# Patient Record
Sex: Female | Born: 1995 | Race: Black or African American | Hispanic: No | Marital: Single | Smoking: Current some day smoker
Health system: Southern US, Community
[De-identification: ages and names within clinical notes are randomized; demographics above are authoritative.]

---

## 2014-08-23 ENCOUNTER — Encounter (HOSPITAL_COMMUNITY): Payer: Self-pay | Admitting: Emergency Medicine

## 2014-08-23 DIAGNOSIS — L500 Allergic urticaria: Secondary | ICD-10-CM | POA: Insufficient documentation

## 2014-08-23 DIAGNOSIS — Y939 Activity, unspecified: Secondary | ICD-10-CM | POA: Insufficient documentation

## 2014-08-23 DIAGNOSIS — Y999 Unspecified external cause status: Secondary | ICD-10-CM | POA: Insufficient documentation

## 2014-08-23 DIAGNOSIS — X58XXXA Exposure to other specified factors, initial encounter: Secondary | ICD-10-CM | POA: Insufficient documentation

## 2014-08-23 DIAGNOSIS — T7840XA Allergy, unspecified, initial encounter: Secondary | ICD-10-CM | POA: Insufficient documentation

## 2014-08-23 DIAGNOSIS — Z720 Tobacco use: Secondary | ICD-10-CM | POA: Insufficient documentation

## 2014-08-23 DIAGNOSIS — R220 Localized swelling, mass and lump, head: Secondary | ICD-10-CM | POA: Insufficient documentation

## 2014-08-23 DIAGNOSIS — Y929 Unspecified place or not applicable: Secondary | ICD-10-CM | POA: Insufficient documentation

## 2014-08-23 NOTE — ED Notes (Signed)
Pt. reports itchy rashes/hives onset this evening at arms , torso and neck with tongue swelling . Respirations unlabored / airway intact.

## 2014-08-24 ENCOUNTER — Emergency Department (HOSPITAL_COMMUNITY)
Admission: EM | Admit: 2014-08-24 | Discharge: 2014-08-24 | Disposition: A | Discharge status: Home / Self Care | Payer: BLUE CROSS/BLUE SHIELD | Attending: Emergency Medicine | Admitting: Emergency Medicine

## 2014-08-24 DIAGNOSIS — T7840XA Allergy, unspecified, initial encounter: Secondary | ICD-10-CM

## 2014-08-24 MED ORDER — EPINEPHRINE 0.3 MG/0.3ML IJ SOAJ
0.3000 mg | Freq: Once | INTRAMUSCULAR | Status: AC
  Administered 2014-08-24: 0.3 mg via INTRAMUSCULAR
  Filled 2014-08-24: qty 0.3

## 2014-08-24 MED ORDER — FAMOTIDINE 20 MG PO TABS: 20.0000 mg | ORAL_TABLET | Freq: Two times a day (BID) | ORAL | Status: DC

## 2014-08-24 MED ORDER — PREDNISONE 20 MG PO TABS: 60.0000 mg | ORAL_TABLET | Freq: Every day | ORAL | Status: DC

## 2014-08-24 MED ORDER — DIPHENHYDRAMINE HCL 25 MG PO TABS: 25.0000 mg | ORAL_TABLET | Freq: Four times a day (QID) | ORAL | Status: DC

## 2014-08-24 MED ORDER — METHYLPREDNISOLONE SODIUM SUCC 125 MG IJ SOLR
125.0000 mg | Freq: Once | INTRAMUSCULAR | Status: AC
  Administered 2014-08-24: 125 mg via INTRAVENOUS
  Filled 2014-08-24: qty 2

## 2014-08-24 MED ORDER — FAMOTIDINE IN NACL 20-0.9 MG/50ML-% IV SOLN
20.0000 mg | Freq: Once | INTRAVENOUS | Status: AC
  Administered 2014-08-24: 20 mg via INTRAVENOUS
  Filled 2014-08-24: qty 50

## 2014-08-24 MED ORDER — EPINEPHRINE 0.3 MG/0.3ML IJ SOAJ: 0.3000 mg | Freq: Once | INTRAMUSCULAR | Status: AC

## 2014-08-24 MED ORDER — DIPHENHYDRAMINE HCL 50 MG/ML IJ SOLN
25.0000 mg | Freq: Once | INTRAMUSCULAR | Status: AC
  Administered 2014-08-24: 25 mg via INTRAVENOUS
  Filled 2014-08-24: qty 1

## 2014-08-24 NOTE — Discharge Instructions (Signed)
Allergies °Allergies may happen from anything your body is sensitive to. This may be food, medicines, pollens, chemicals, and nearly anything around you in everyday life that produces allergens. An allergen is anything that causes an allergy producing substance. Heredity is often a factor in causing these problems. This means you may have some of the same allergies as your parents. °Food allergies happen in all age groups. Food allergies are some of the most severe and life threatening. Some common food allergies are cow's milk, seafood, eggs, nuts, wheat, and soybeans. °SYMPTOMS  °· Swelling around the mouth. °· An itchy red rash or hives. °· Vomiting or diarrhea. °· Difficulty breathing. °SEVERE ALLERGIC REACTIONS ARE LIFE-THREATENING. °This reaction is called anaphylaxis. It can cause the mouth and throat to swell and cause difficulty with breathing and swallowing. In severe reactions only a trace amount of food (for example, peanut oil in a salad) may cause death within seconds. °Seasonal allergies occur in all age groups. These are seasonal because they usually occur during the same season every year. They may be a reaction to molds, grass pollens, or tree pollens. Other causes of problems are house dust mite allergens, pet dander, and mold spores. The symptoms often consist of nasal congestion, a runny itchy nose associated with sneezing, and tearing itchy eyes. There is often an associated itching of the mouth and ears. The problems happen when you come in contact with pollens and other allergens. Allergens are the particles in the air that the body reacts to with an allergic reaction. This causes you to release allergic antibodies. Through a chain of events, these eventually cause you to release histamine into the blood stream. Although it is meant to be protective to the body, it is this release that causes your discomfort. This is why you were given anti-histamines to feel better.  If you are unable to  pinpoint the offending allergen, it may be determined by skin or blood testing. Allergies cannot be cured but can be controlled with medicine. °Hay fever is a collection of all or some of the seasonal allergy problems. It may often be treated with simple over-the-counter medicine such as diphenhydramine. Take medicine as directed. Do not drink alcohol or drive while taking this medicine. Check with your caregiver or package insert for child dosages. °If these medicines are not effective, there are many new medicines your caregiver can prescribe. Stronger medicine such as nasal spray, eye drops, and corticosteroids may be used if the first things you try do not work well. Other treatments such as immunotherapy or desensitizing injections can be used if all else fails. Follow up with your caregiver if problems continue. These seasonal allergies are usually not life threatening. They are generally more of a nuisance that can often be handled using medicine. °HOME CARE INSTRUCTIONS  °· If unsure what causes a reaction, keep a diary of foods eaten and symptoms that follow. Avoid foods that cause reactions. °· If hives or rash are present: °· Take medicine as directed. °· You may use an over-the-counter antihistamine (diphenhydramine) for hives and itching as needed. °· Apply cold compresses (cloths) to the skin or take baths in cool water. Avoid hot baths or showers. Heat will make a rash and itching worse. °· If you are severely allergic: °· Following a treatment for a severe reaction, hospitalization is often required for closer follow-up. °· Wear a medic-alert bracelet or necklace stating the allergy. °· You and your family must learn how to give adrenaline or use   an anaphylaxis kit.  If you have had a severe reaction, always carry your anaphylaxis kit or EpiPen with you. Use this medicine as directed by your caregiver if a severe reaction is occurring. Failure to do so could have a fatal outcome. SEEK MEDICAL  CARE IF:  You suspect a food allergy. Symptoms generally happen within 30 minutes of eating a food.  Your symptoms have not gone away within 2 days or are getting worse.  You develop new symptoms.  You want to retest yourself or your child with a food or drink you think causes an allergic reaction. Never do this if an anaphylactic reaction to that food or drink has happened before. Only do this under the care of a caregiver. SEEK IMMEDIATE MEDICAL CARE IF:   You have difficulty breathing, are wheezing, or have a tight feeling in your chest or throat.  You have a swollen mouth, or you have hives, swelling, or itching all over your body.  You have had a severe reaction that has responded to your anaphylaxis kit or an EpiPen. These reactions may return when the medicine has worn off. These reactions should be considered life threatening. MAKE SURE YOU:   Understand these instructions.  Will watch your condition.  Will get help right away if you are not doing well or get worse. Document Released: 05/20/2002 Document Revised: 06/21/2012 Document Reviewed: 10/25/2007 [facility] Patient Information 2015 [place], [place]. This information is not intended to replace advice given to you by your health care provider. Make sure you discuss any questions you have with your health care provider.  Epinephrine Injection Epinephrine is a medicine given by injection to temporarily treat an emergency allergic reaction. It is also used to treat severe asthmatic attacks and other lung problems. The medicine helps to enlarge (dilate) the small breathing tubes of the lungs. A life-threatening, sudden allergic reaction that involves the whole body is called anaphylaxis. Because of potential side effects, epinephrine should only be used as directed by your caregiver. RISKS AND COMPLICATIONS Possible side effects of epinephrine injections include:  Chest pain.  Irregular or rapid heartbeat.  Shortness of  breath.  Nausea.  Vomiting.  Abdominal pain or cramping.  Sweating.  Dizziness.  Weakness.  Headache.  Nervousness. Report all side effects to your caregiver. HOW TO GIVE AN EPINEPHRINE INJECTION Give the epinephrine injection immediately when symptoms of a severe reaction begin. Inject the medicine into the outer thigh or any available, large muscle. Your caregiver can teach you how to do this. You do not need to remove any clothing. After the injection, call your local emergency services (911 in [place] Even if you improve after the injection, you need to be examined at a hospital emergency department. Epinephrine works quickly, but it also wears off quickly. Delayed reactions can occur. A delayed reaction may be as serious and dangerous as the initial reaction. HOME CARE INSTRUCTIONS  Make sure you and your family know how to give an epinephrine injection.  Use epinephrine injections as directed by your caregiver. Do not use this medicine more often or in larger doses than prescribed.  Always carry your epinephrine injection or anaphylaxis kit with you. This can be lifesaving if you have a severe reaction.  Store the medicine in a cool, dry place. If the medicine becomes discolored or cloudy, dispose of it properly and replace it with new medicine.  Check the expiration date on your medicine. It may be unsafe to use medicines past their expiration date.  Tell your caregiver about any other medicines you are taking. Some medicines can react badly with epinephrine.  Tell your caregiver about any medical conditions you have, such as diabetes, high blood pressure (hypertension), heart disease, irregular heartbeats, or if you are pregnant. SEEK IMMEDIATE MEDICAL CARE IF:  You have used an epinephrine injection. Call your local emergency services (911 in [place] Even if you improve after the injection, you need to be examined at a hospital emergency department to make sure your  allergic reaction is under control. You will also be monitored for adverse effects from the medicine.  You have chest pain.  You have irregular or fast heartbeats.  You have shortness of breath.  You have severe headaches.  You have severe nausea, vomiting, or abdominal cramps.  You have severe pain, swelling, or redness in the area where you gave the injection. Document Released: 02/22/2000 Document Revised: 05/19/2011 Document Reviewed: 11/13/2010 [facility] Patient Information 2015 [place], [place]. This information is not intended to replace advice given to you by your health care provider. Make sure you discuss any questions you have with your health care provider.

## 2014-08-24 NOTE — ED Provider Notes (Signed)
CSN: [id number]     Arrival date & time 08/23/14  2345 History  This chart was scribed for Marisa Severin, MD by Annye Asa, ED Scribe. This patient was seen in room D31C/D31C and the patient's care was started at 12:42 AM.    Chief Complaint  Patient presents with  . Urticaria   The history is provided by the patient. No language interpreter was used.     HPI Comments: Jean Rice is a 19 y.o. female who presents to the Emergency Department complaining of 1 hour of gradually improving red, "hot" rash to arms, torso, and neck with associated tongue swelling. She reports difficulty swallowing and breathing; describes, "My tongue is blocking my airway, it feels really swollen. 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 She denies any other new exposures. She denies itching.   No PCP  History reviewed. No pertinent past medical history. History reviewed. No pertinent past surgical history. No family history on file. History  Substance Use Topics  . Smoking status: Current Some Day Smoker  . Smokeless tobacco: Not on file  . Alcohol Use: Yes    Review of Systems  HENT: Positive for trouble swallowing.        Tongue swelling  Skin: Positive for rash.  All other systems reviewed and are negative.   Allergies  Review of patient's allergies indicates no known allergies.  Home Medications   Prior to Admission medications   Not on File   BP 118/69 mmHg  Pulse 80  Temp(Src) 99.1 F (37.3 C)  Resp 20  Ht 5\' 6"  (1.676 m)  Wt 135 lb (61.236 kg)  BMI 21.80 kg/m2  SpO2 97% Physical Exam  Constitutional: He is oriented to person, place, and time. He appears well-developed and well-nourished. No distress.  HENT:  Head: Normocephalic and atraumatic.  Right Ear: Tympanic membrane normal.  Left Ear: Tympanic membrane normal.  Mouth/Throat:  Oropharynx is clear and moist. No oropharyngeal exudate.  Tongue mildly enlarged  Eyes: EOM are normal. Pupils are equal, round, and reactive to light.  Neck: Normal range of motion. Neck supple. No JVD present.  Cardiovascular: Normal rate, regular rhythm and normal heart sounds.  Exam reveals no gallop and no friction rub.   No murmur heard. Pulmonary/Chest: Effort normal and breath sounds normal. No respiratory distress. He has no wheezes. He has no rales.  Abdominal: Soft. Bowel sounds are normal. He exhibits no mass. There is no tenderness. There is no rebound and no guarding.  Musculoskeletal: Normal range of motion. He exhibits no edema.  Moves all extremities normally.   Neurological: He is alert and oriented to person, place, and time. He displays normal reflexes.  Skin: Skin is warm and dry. Rash (Hives to left side of face and left arm) noted.  Urticaria to left face, arm  Psychiatric: He has a normal mood and affect. His behavior is normal.  Nursing note and vitals reviewed.   ED Course  Procedures   DIAGNOSTIC STUDIES: Oxygen Saturation is 97% on RA, adequate by my interpretation.    COORDINATION OF CARE: 12:49 AM Discussed treatment plan with pt at bedside and pt agreed to plan.   Labs Review Labs Reviewed - No data to display  Imaging Review No results found.   EKG Interpretation None      MDM  Final diagnoses:  Allergic reaction, initial encounter    I personally performed the services described in this documentation, which was scribed in my presence. The recorded information has been reviewed and is accurate.  19 yo female with urticaria, mild swelling of tongue without airway compromise.  To be treated with benadryl, pepcid, solumedrol and epi-pen.  WIll continue to monitor.  Pt has had improvement, has been here for 2 hours.  Pt is insistent on leaving.  Return precautions given.     Marisa Severin, MD 08/24/14 [phone]

## 2014-11-09 ENCOUNTER — Encounter (HOSPITAL_COMMUNITY): Payer: Self-pay | Admitting: Registered Nurse

## 2014-11-09 ENCOUNTER — Emergency Department (HOSPITAL_COMMUNITY)
Admission: EM | Admit: 2014-11-09 | Discharge: 2014-11-09 | Disposition: A | Discharge status: Home / Self Care | Payer: BLUE CROSS/BLUE SHIELD | Attending: Emergency Medicine | Admitting: Emergency Medicine

## 2014-11-09 ENCOUNTER — Encounter (HOSPITAL_COMMUNITY): Payer: Self-pay | Admitting: Emergency Medicine

## 2014-11-09 ENCOUNTER — Ambulatory Visit (HOSPITAL_COMMUNITY)
Admission: RE | Admit: 2014-11-09 | Discharge: 2014-11-09 | Disposition: A | Discharge status: Home / Self Care | Payer: BLUE CROSS/BLUE SHIELD | Attending: Psychiatry | Admitting: Psychiatry

## 2014-11-09 DIAGNOSIS — F4323 Adjustment disorder with mixed anxiety and depressed mood: Secondary | ICD-10-CM | POA: Insufficient documentation

## 2014-11-09 DIAGNOSIS — Z3202 Encounter for pregnancy test, result negative: Secondary | ICD-10-CM | POA: Insufficient documentation

## 2014-11-09 DIAGNOSIS — F419 Anxiety disorder, unspecified: Secondary | ICD-10-CM

## 2014-11-09 DIAGNOSIS — E010 Iodine-deficiency related diffuse (endemic) goiter: Secondary | ICD-10-CM

## 2014-11-09 DIAGNOSIS — Z720 Tobacco use: Secondary | ICD-10-CM | POA: Insufficient documentation

## 2014-11-09 LAB — COMPREHENSIVE METABOLIC PANEL
ALBUMIN: 4.6 g/dL (ref 3.5–5.0)
ALK PHOS: 62 U/L (ref 38–126)
ALT: 26 U/L (ref 14–54)
AST: 25 U/L (ref 15–41)
Anion gap: 4 — ABNORMAL LOW (ref 5–15)
BILIRUBIN TOTAL: 1.1 mg/dL (ref 0.3–1.2)
BUN: 11 mg/dL (ref 6–20)
CO2: 25 mmol/L (ref 22–32)
Calcium: 9.3 mg/dL (ref 8.9–10.3)
Chloride: 108 mmol/L (ref 101–111)
Creatinine, Ser: 0.6 mg/dL (ref 0.44–1.00)
GFR calc Af Amer: >60 mL/min (ref >60)
GFR calc non Af Amer: >60 mL/min (ref >60)
Glucose, Bld: 99 mg/dL (ref 65–99)
Potassium: 4.2 mmol/L (ref 3.5–5.1)
SODIUM: 137 mmol/L (ref 135–145)
TOTAL PROTEIN: 8.1 g/dL (ref 6.5–8.1)

## 2014-11-09 LAB — RAPID URINE DRUG SCREEN, HOSP PERFORMED
AMPHETAMINES: NOT DETECTED
BARBITURATES: NOT DETECTED
Benzodiazepines: NOT DETECTED
Cocaine: NOT DETECTED
OPIATES: NOT DETECTED
TETRAHYDROCANNABINOL: NOT DETECTED

## 2014-11-09 LAB — CBC WITH DIFFERENTIAL/PLATELET
Basophils Absolute: 0 10*3/uL (ref 0.0–0.1)
Basophils Relative: 0 % (ref 0–1)
EOS ABS: 0.1 10*3/uL (ref 0.0–0.7)
EOS PCT: 1 % (ref 0–5)
HEMATOCRIT: 33.7 % — AB (ref 36.0–46.0)
Hemoglobin: 10.7 g/dL — ABNORMAL LOW (ref 12.0–15.0)
LYMPHS PCT: 22 % (ref 12–46)
Lymphs Abs: 1.6 10*3/uL (ref 0.7–4.0)
MCH: 21.1 pg — AB (ref 26.0–34.0)
MCHC: 31.8 g/dL (ref 30.0–36.0)
MCV: 66.5 fL — ABNORMAL LOW (ref 78.0–100.0)
Monocytes Absolute: 0.4 10*3/uL (ref 0.1–1.0)
Monocytes Relative: 6 % (ref 3–12)
NEUTROS ABS: 5 10*3/uL (ref 1.7–7.7)
Neutrophils Relative %: 71 % (ref 43–77)
Platelets: 347 10*3/uL (ref 150–400)
RBC: 5.07 MIL/uL (ref 3.87–5.11)
RDW: 15.7 % — ABNORMAL HIGH (ref 11.5–15.5)
WBC: 7.1 10*3/uL (ref 4.0–10.5)

## 2014-11-09 LAB — ETHANOL: Alcohol, Ethyl (B): <5 mg/dL (ref <5)

## 2014-11-09 LAB — TSH: TSH: 0.72 u[IU]/mL (ref 0.350–4.500)

## 2014-11-09 LAB — T4, FREE: Free T4: 0.84 ng/dL (ref 0.61–1.12)

## 2014-11-09 LAB — I-STAT BETA HCG BLOOD, ED (MC, WL, AP ONLY)

## 2014-11-09 LAB — PREGNANCY, URINE: PREG TEST UR: NEGATIVE

## 2014-11-09 NOTE — ED Provider Notes (Signed)
CSN: [id number]     Arrival date & time 11/09/14  1050 History   First MD Initiated Contact with Patient 11/09/14 1056     No chief complaint on file.    (Consider location/radiation/quality/duration/timing/severity/associated sxs/prior Treatment) HPI 19 year old female who presents today from [facility]. She presented to them stating that she was not feeling very motivated and is having difficulty concentrating. She was seen and evaluated there by his practitioner and her note is referenced. Here patient denies depression, suicidal ideation, homicidal ideation, or psychotic features. She states that she does feel safe being discharged. She states that she has had some difficulties with concentrating recently. She states she is taking online classes and feels that she has difficulty being motivated to do her work. 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 She states this is very unstructured and she can come and go as she wants which she feels has created some problems for her. She denies any history of mental health diagnoses or problems. She states she has had some self diagnosed anxiety in the past. She states that she feels somewhat anxious here. She denies any sleep difficulties, changes in eating habits, or weight. She was told the past that her thyroid was enlarged and she should have followed up but she did not. She states this was several years ago. History reviewed. No pertinent past medical history. History reviewed. No pertinent past surgical history. No family history on file. Social History  Substance Use Topics  . Smoking status: Current Some Day Smoker  . Smokeless tobacco: None  . Alcohol Use: 1.2 oz/week    2 Shots of liquor per week   OB History    No data available     Review of Systems  All other systems reviewed and are negative.     Allergies  Review of patient's allergies indicates no known allergies.  Home Medications   Prior to Admission  medications   Medication Sig Start Date End Date Taking? Authorizing Provider  EPINEPHrine 0.3 mg/0.3 mL IJ SOAJ injection Inject 0.3 mLs (0.3 mg total) into the muscle once. 08/24/14  Yes Marisa Severin, MD  diphenhydrAMINE (BENADRYL) 25 MG tablet Take 1 tablet (25 mg total) by mouth every 6 (six) hours. Patient not taking: Reported on 11/09/2014 08/24/14   Marisa Severin, MD  famotidine (PEPCID) 20 MG tablet Take 1 tablet (20 mg total) by mouth 2 (two) times daily. Patient not taking: Reported on 11/09/2014 08/24/14   Marisa Severin, MD  predniSONE (DELTASONE) 20 MG tablet Take 3 tablets (60 mg total) by mouth daily. Patient not taking: Reported on 11/09/2014 08/24/14   Marisa Severin, MD   BP 124/63 mmHg  Pulse 67  Resp 18  SpO2 100% Physical Exam  Constitutional: She is oriented to person, place, and time. She appears well-developed and well-nourished.  HENT:  Head: Normocephalic and atraumatic.  Right Ear: External ear normal.  Left Ear: External ear normal.  Nose: Nose normal.  Mouth/Throat: Oropharynx is clear and moist.  Eyes: Conjunctivae and EOM are normal. Pupils are equal, round, and reactive to light.  Neck: Normal range of motion. Neck supple. Thyromegaly present.  Cardiovascular: Normal rate, regular rhythm, normal heart sounds and intact distal pulses.   Pulmonary/Chest: Effort normal and breath sounds normal.  Abdominal: Soft. Bowel sounds are normal.  Musculoskeletal: Normal range of motion.  Neurological: She is alert and oriented to person, place, and time. She has normal reflexes.  Skin: Skin is warm and dry.  Psychiatric: She has a normal mood and affect. Her behavior is normal. Judgment and thought content normal.  Nursing note and vitals reviewed.   ED Course  Procedures (including critical care time) Labs Review Labs Reviewed  T3  T4  T4, FREE  TSH    Imaging Review No results found. I have personally reviewed and evaluated these images and lab results as part of my  medical decision-making.   EKG Interpretation None      MDM   Final diagnoses:  Thyromegaly  Anxiety   patient is hemodynamically stable here. She does not have any indications for hospitalization. Patient has been seen and evaluated at behavioral health and has follow-up set up at [facility]. He instructed her that she can receive counseling their beginning immediately. Her thyroid does seem somewhat enlarged here. Her TSH is normal. I will set her up for outpatient follow-up for this. I have re-examined patient. I discussed her symptoms with her. She continues to deny any significant signs of depression such as suicidality or psychosis. She is eating, sleeping, and going about her activities of daily living. She does express some anxiety which appears to be appropriate regarding her schoolwork. She has follow-up set up for her Behavioral Health with a psychiatrist on October 3 and has referrals for immediate counseling. I will given her referral for follow-up for recheck of her thyroid.  Margarita Grizzle, MD 11/09/14 [phone]

## 2014-11-09 NOTE — Clinical Social Work Note (Signed)
Pt was transported via [facility] to [facility] ED  .[place], [place] [facility]

## 2014-11-09 NOTE — Progress Notes (Addendum)
Pt confirms with ED Cm she does not have pcp, is a [employer] student and had been here  Pt  Has not seen counselors or psychiatry locally  Confirmed pt address and telephone    CM discussed and provided written information for uninsured accepting pcps, discussed the importance of pcp vs EDP services for f/u care, www.needymeds.org, www.goodrx.com, discounted pharmacies and other [employer] such as [facility] , [facility], affordable care act, financial assistance, uninsured dental services, [place] med assist, DSS and  health department  Reviewed resources for [employer] uninsured accepting pcps like [facility], family medicine at [facility], [facility], [facility], local urgent care centers, [facility], [facility] family practice, general medical clinics, family services of the [place], [facility] urgent care plus others, medication resources, [facility] out patient pharmacies and housing Pt voiced understanding and appreciation of resources provided   Provided [facility] contact information  Pt inquired about a pregnancy test CM updated ED RN, Matt & EDP, Ray Pt updated test to be offered

## 2014-11-09 NOTE — ED Notes (Signed)
Pt being sent over from [facility], pt will already be changed out. Pt needs blood work and urine and then can go back to [facility] directly.

## 2014-11-09 NOTE — Consult Note (Signed)
  Jean Rice  12-Dec-1995  [id number]   Spoke with patient face to face as a walk in at [facility].  Patient states that she is not feeling her self. 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  Patient denies suicidal thoughts and states that she does not want to kill herself but was uncomfortable when trying to set her up with outpatient services and send her home.  Patient has no psych history.  She denies homicidal ideation, psychosis, and paranoia.  Patient has and appointment set with [facility] outpatient services set for October 3rd.   Spoke with Dr. Jannifer Franklin and discussed patient uneasiness when attempting to send her home after setting up outpatient services and informed that felt more comfortable if patient was observed over night to assure that patient wasn't a danger to her self and was appropriate for only outpatient services at this time.  Patient in agreement that she does not need to be home at this time.    Spoke with charge nurse at [facility] RN and instructed to have patient change clothes and sent to room TR4.  Patient will be sent to [facility] via [facility].   Shuvon B. Rankin FNP-BC  Patient seen face-to-face for psychiatric evaluation, chart reviewed and case discussed with the physician extender and developed treatment plan. Reviewed the information documented and agree with the treatment plan. Thedore Mins, MD

## 2014-11-09 NOTE — ED Notes (Signed)
Patient from [facility], c/o feeling less motivated than usually and difficulty concentrating x past several months. States she is in school and does not focus well or try as hard in school as she should. 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 Patient states she wants to spend the night at [facility] to help her get more focused and stop slacking off at school. On exam, thyroid is enlarged. Pt states she was instructed to get a thyroid biopsy but never did. Denies any other medical or psychiatric history.

## 2014-11-09 NOTE — Discharge Instructions (Signed)
These follow-up with counselor and psychiatrist as previously referred. Please recheck with physician for follow-up regarding thyroid.

## 2014-11-09 NOTE — ED Notes (Signed)
Bed: [facility] Expected date:  Expected time:  Means of arrival:  Comments: [facility]
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
## 2014-11-10 LAB — T3: T3, Total: 113 ng/dL (ref 71–180)

## 2014-11-10 LAB — T4: T4, Total: 9.2 ug/dL (ref 4.5–12.0)

## 2014-12-10 ENCOUNTER — Emergency Department (HOSPITAL_COMMUNITY)
Admission: EM | Admit: 2014-12-10 | Discharge: 2014-12-10 | Disposition: A | Discharge status: Home / Self Care | Payer: BLUE CROSS/BLUE SHIELD | Attending: Emergency Medicine | Admitting: Emergency Medicine

## 2014-12-10 ENCOUNTER — Encounter (HOSPITAL_COMMUNITY): Payer: Self-pay | Admitting: *Deleted

## 2014-12-10 DIAGNOSIS — F32A Depression, unspecified: Secondary | ICD-10-CM | POA: Insufficient documentation

## 2014-12-10 DIAGNOSIS — F4323 Adjustment disorder with mixed anxiety and depressed mood: Secondary | ICD-10-CM | POA: Diagnosis not present

## 2014-12-10 DIAGNOSIS — Z720 Tobacco use: Secondary | ICD-10-CM | POA: Insufficient documentation

## 2014-12-10 DIAGNOSIS — F329 Major depressive disorder, single episode, unspecified: Secondary | ICD-10-CM | POA: Insufficient documentation

## 2014-12-10 NOTE — ED Notes (Signed)
PA at bedside Pt alert and oriented x4. Respirations even and unlabored, bilateral symmetrical rise and fall of chest. Skin warm and dry. In no acute distress. Denies needs.   

## 2014-12-10 NOTE — Progress Notes (Signed)
Received report from RN. TTS to initiate assessment

## 2014-12-10 NOTE — ED Notes (Addendum)
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   Pt has stopped working as a Horticulturist, commercial.At present pt denies SI/HI, AH/VH. Reports she thinks she is depressed, has lack of motivation or drive, and difficulty sleeping x1 month. Denies pain. Denies ETOH, cigarettes, or drugs.  No hx of self harm. Pt contracts for safety. Pt not in counseling, not taking any psychiatric medications.  Pt asking if they ever let people stay overnight for depression. rn explained that the provider would come in and talk with pt.

## 2014-12-10 NOTE — BHH Suicide Risk Assessment (Signed)
Suicide Risk Assessment  Discharge Assessment   [facility] Discharge Suicide Risk Assessment   Demographic Factors:  Low socioeconomic status, Living alone and Unemployed  Total Time spent with patient: 20 minutes  Musculoskeletal: Strength & Muscle Tone: within normal limits Gait & Station: normal Patient leans: N/A  Psychiatric Specialty Exam:     Blood pressure 114/57, pulse 72, temperature 99.5 F (37.5 C), temperature source Oral, resp. rate 16, last menstrual period 11/24/2014, SpO2 100 %.There is no weight on file to calculate BMI.  General Appearance: Casual and Fairly Groomed  Patent attorney::  Good  Speech:  Clear and Coherent and Normal Rate  Volume:  Normal  Mood:  Anxious and Depressed  Affect:  Congruent and Depressed  Thought Process:  Coherent, Goal Directed and Intact  Orientation:  Full (Time, Place, and Person)  Thought Content:  WDL  Suicidal Thoughts:  No  Homicidal Thoughts:  No  Memory:  Immediate;   Good Recent;   Good Remote;   Good  Judgement:  Good  Insight:  Fair  Psychomotor Activity:  Normal  Concentration:  Good  Recall:  NA  Fund of Knowledge:Fair  Language: Good  Akathisia:  NA  Handed:  Right  AIMS (if indicated):     Assets:  Desire for Improvement  ADL's:  Intact  Cognition: WNL        Has this patient used any form of tobacco in the last 30 days? (Cigarettes, Smokeless Tobacco, Cigars, and/or Pipes) N/A  Mental Status Per Nursing Assessment::   On Admission:     Current Mental Status by Physician: NA  Loss Factors: NA  Historical Factors: NA  Risk Reduction Factors:   Living with another person, especially a relative  Continued Clinical Symptoms:  Depression:   Insomnia  Cognitive Features That Contribute To Risk:  Polarized thinking    Suicide Risk:  Minimal: No identifiable suicidal ideation.  Patients presenting with no risk factors but with morbid ruminations; may be classified as minimal risk based on the  severity of the depressive symptoms  Principal Problem: Adjustment disorder with mixed anxiety and depressed mood Discharge Diagnoses:  Patient Active Problem List   Diagnosis Date Noted  . Adjustment disorder with mixed anxiety and depressed mood [F43.23] 11/09/2014    Priority: High  . Depression [F32.9]     Follow-up Information    Follow up with Please use the resources provided.      Plan Of Care/Follow-up recommendations:  Activity:  as tolerated Diet:  regular  Is patient on multiple antipsychotic therapies at discharge:  No   Has Patient had three or more failed trials of antipsychotic monotherapy by history:  No  Recommended Plan for Multiple Antipsychotic Therapies: NA    Earney Navy   PMHNP-BC 12/10/2014, 4:22 PM   Patient seen and I agree with treatment and plan  Jamse Belfast.D.

## 2014-12-10 NOTE — Progress Notes (Signed)
Unable to reach RN to complete and initiate tele-assessment. Will re-attempt again.    Janann Colonel. MSW, LCSW Therapeutic Triage Services-Triage Specialist   Phone: [phone]

## 2014-12-10 NOTE — Consult Note (Signed)
[facility] Face-to-Face Psychiatry Consult   Reason for Consult:  Depressive disorder with anxiety Referring Physician:  EDP Patient Identification: Jean Rice MRN:  [id number] Principal Diagnosis: Adjustment disorder with mixed anxiety and depressed mood Diagnosis:   Patient Active Problem List   Diagnosis Date Noted  . Adjustment disorder with mixed anxiety and depressed mood [F43.23] 11/09/2014    Priority: High    Total Time spent with patient: 1 hour  Subjective:   Jean Rice is a 19 y.o. female patient admitted with Depressive disorder with anxiety.  HPI:  AA female, 19 years old was evaluated today for feeling depressed and anxious.  She is a Consulting civil engineer at [employer] and was seen last month for symptoms of depression and anxiety.  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  Patient reports that since she stopped dancing in clubs she has been feeling depressed, insomnic and self isolating.  She noted she has no motivation for anything and stated that her concentration is poor.  Patient states she does not have friends any more and she would like to have a boy friend.  She reports her family members were angry at her for dancing in the club but are now supportive of her.  She reported one time counseling at her university but did not benefit from counseling.  She denies SI/HI/AVH and denies previous suicide attempt.  She denies Alcohol and drug use.  Past Psychiatric History:  Denies  Risk to Self: Suicidal Ideation: No Suicidal Intent: No Is patient at risk for suicide?: No Suicidal Plan?: No Access to Means: No What has been your use of drugs/alcohol within the last 12 months?: Patient denies How many times?: 0 Other Self Harm Risks: None Triggers for Past Attempts: None known Intentional Self Injurious Behavior: None Risk to Others: Homicidal Ideation: No Thoughts of Harm to Others: No Current Homicidal Intent:  No Current Homicidal Plan: No Access to Homicidal Means: No Identified Victim: None History of harm to others?: No Assessment of Violence: None Noted Violent Behavior Description: Patient is calm and cooperative Does patient have access to weapons?: No Criminal Charges Pending?: No Does patient have a court date: No Prior Inpatient Therapy: Prior Inpatient Therapy: No Prior Outpatient Therapy: Prior Outpatient Therapy: No  Past Medical History: History reviewed. No pertinent past medical history. History reviewed. No pertinent past surgical history. Family History: History reviewed. No pertinent family history.   Family Psychiatric  History: Denies Social History:  History  Alcohol Use  . 1.2 oz/week  . 2 Shots of liquor per week     History  Drug Use No    Comment: Patient denies use     Social History   Social History  . Marital Status: Single    Spouse Name: N/A  . Number of Children: N/A  . Years of Education: N/A   Social History Main Topics  . Smoking status: Current Some Day Smoker  . Smokeless tobacco: None  . Alcohol Use: 1.2 oz/week    2 Shots of liquor per week  . Drug Use: No     Comment: Patient denies use   . Sexual Activity: Not Asked   Other Topics Concern  . None   Social History Narrative   Additional Social History:    History of alcohol / drug use?: No history of alcohol / drug abuse    Allergies:  No Known Allergies  Labs: No results found for this or  any previous visit (from the past 48 hour(s)).  No current facility-administered medications for this encounter.   Current Outpatient Prescriptions  Medication Sig Dispense Refill  . EPINEPHrine 0.3 mg/0.3 mL IJ SOAJ injection Inject 0.3 mLs (0.3 mg total) into the muscle once. 1 Device 1    Musculoskeletal: Strength & Muscle Tone: within normal limits Gait & Station: normal Patient leans: N/A  Psychiatric Specialty Exam: Review of Systems  Constitutional: Negative.   HENT:  Negative.   Eyes: Negative.   Respiratory: Negative.   Cardiovascular: Negative.   Gastrointestinal: Negative.   Genitourinary: Negative.   Musculoskeletal: Negative.   Skin: Negative.   Neurological: Negative.   Endo/Heme/Allergies: Negative.     Blood pressure 114/57, pulse 72, temperature 99.5 F (37.5 C), temperature source Oral, resp. rate 16, last menstrual period 11/24/2014, SpO2 100 %.There is no weight on file to calculate BMI.  General Appearance: Casual and Fairly Groomed  Eye Contact::  Good  Speech:  Clear and Coherent and Normal Rate  Volume:  Normal  Mood:  Anxious and Depressed  Affect:  Congruent and Depressed  Thought Process:  Coherent, Goal Directed and Intact  Orientation:  Full (Time, Place, and Person)  Thought Content:  WDL  Suicidal Thoughts:  No  Homicidal Thoughts:  No  Memory:  Immediate;   Good Recent;   Good Remote;   Good  Judgement:  Good  Insight:  Fair  Psychomotor Activity:  Normal  Concentration:  Good  Recall:  NA  Fund of Knowledge:Fair  Language: Good  Akathisia:  NA  Handed:  Right  AIMS (if indicated):     Assets:  Desire for Improvement  ADL's:  Intact  Cognition: WNL  Sleep:      Disposition: Discharge home, seek outpatient counseling and MH care.  Written information is provided  Earney Navy    PMHNP-BC 12/10/2014 3:18 PM   Patient seen and I agree with treatment and plan  Diannia Ruder M.D.

## 2014-12-10 NOTE — ED Notes (Signed)
telepsych machine at bedside  

## 2014-12-10 NOTE — Discharge Instructions (Signed)
Read the information below.  You may return to the Emergency Department at any time for worsening condition or any new symptoms that concern you.   Please see your primary care provider or student health to discuss possible medications that may be helpful to you.  Use the resources provided below both for primary care and for counseling.    Depression Depression refers to feeling sad, low, down in the dumps, blue, gloomy, or empty. In general, there are two kinds of depression: 1. Normal sadness or normal grief. This kind of depression is one that we all feel from time to time after upsetting life experiences, such as the loss of a job or the ending of a relationship. This kind of depression is considered normal, is short lived, and resolves within a few days to 2 weeks. Depression experienced after the loss of a loved one (bereavement) often lasts longer than 2 weeks but normally gets better with time. 2. Clinical depression. This kind of depression lasts longer than normal sadness or normal grief or interferes with your ability to function at home, at work, and in school. It also interferes with your personal relationships. It affects almost every aspect of your life. Clinical depression is an illness. Symptoms of depression can also be caused by conditions other than those mentioned above, such as:  Physical illness. Some physical illnesses, including underactive thyroid gland (hypothyroidism), severe anemia, specific types of cancer, diabetes, uncontrolled seizures, heart and lung problems, strokes, and chronic pain are commonly associated with symptoms of depression.  Side effects of some prescription medicine. In some people, certain types of medicine can cause symptoms of depression.  Substance abuse. Abuse of alcohol and illicit drugs can cause symptoms of depression. SYMPTOMS Symptoms of normal sadness and normal grief include the following:  Feeling sad or crying for short periods of  time.  Not caring about anything (apathy).  Difficulty sleeping or sleeping too much.  No longer able to enjoy the things you used to enjoy.  Desire to be by oneself all the time (social isolation).  Lack of energy or motivation.  Difficulty concentrating or remembering.  Change in appetite or weight.  Restlessness or agitation. Symptoms of clinical depression include the same symptoms of normal sadness or normal grief and also the following symptoms:  Feeling sad or crying all the time.  Feelings of guilt or worthlessness.  Feelings of hopelessness or helplessness.  Thoughts of suicide or the desire to harm yourself (suicidal ideation).  Loss of touch with reality (psychotic symptoms). Seeing or hearing things that are not real (hallucinations) or having false beliefs about your life or the people around you (delusions and paranoia). DIAGNOSIS  The diagnosis of clinical depression is usually based on how bad the symptoms are and how long they have lasted. Your health care provider will also ask you questions about your medical history and substance use to find out if physical illness, use of prescription medicine, or substance abuse is causing your depression. Your health care provider may also order blood tests. TREATMENT  Often, normal sadness and normal grief do not require treatment. However, sometimes antidepressant medicine is given for bereavement to ease the depressive symptoms until they resolve. The treatment for clinical depression depends on how bad the symptoms are but often includes antidepressant medicine, counseling with a mental health professional, or both. Your health care provider will help to determine what treatment is best for you. Depression caused by physical illness usually goes away with appropriate medical  treatment of the illness. If prescription medicine is causing depression, talk with your health care provider about stopping the medicine, decreasing  the dose, or changing to another medicine. Depression caused by the abuse of alcohol or illicit drugs goes away when you stop using these substances. Some adults need professional help in order to stop drinking or using drugs. SEEK IMMEDIATE MEDICAL CARE IF:  You have thoughts about hurting yourself or others.  You lose touch with reality (have psychotic symptoms).  You are taking medicine for depression and have a serious side effect. FOR MORE INFORMATION  National Alliance on Mental Illness: www.nami.[employer] of Mental Health: http://www.maynard.net/ Document Released: 02/22/2000 Document Revised: 07/11/2013 Document Reviewed: 05/26/2011 [facility] Patient Information 2015 [place], [place]. This information is not intended to replace advice given to you by your health care provider. Make sure you discuss any questions you have with your health care provider.   Emergency Department Resource Guide 1) Find a Doctor and Pay Out of Pocket Although you won't have to find out who is covered by your insurance plan, it is a good idea to ask around and get recommendations. You will then need to call the office and see if the doctor you have chosen will accept you as a new patient and what types of options they offer for patients who are self-pay. Some doctors offer discounts or will set up payment plans for their patients who do not have insurance, but you will need to ask so you aren't surprised when you get to your appointment.  2) Contact Your Local Health Department Not all health departments have doctors that can see patients for sick visits, but many do, so it is worth a call to see if yours does. If you don't know where your local health department is, you can check in your phone book. The CDC also has a tool to help you locate your state's health department, and many state websites also have listings of all of their local health departments.  3) Find a Walk-in Clinic If your illness  is not likely to be very severe or complicated, you may want to try a walk in clinic. These are popping up all over the country in pharmacies, drugstores, and shopping centers. They're usually staffed by nurse practitioners or physician assistants that have been trained to treat common illnesses and complaints. They're usually fairly quick and inexpensive. However, if you have serious medical issues or chronic medical problems, these are probably not your best option.  No Primary Care Doctor: - Call Health Connect at  [phone] - they can help you locate a primary care doctor that  accepts your insurance, provides certain services, etc. - Physician Referral Service- [phone]  Chronic Pain Problems: Organization         Address  Phone   Notes  [facility]  [phone] Patients need to be referred by their primary care doctor.   Medication Assistance: Organization         Address  Phone   Notes  [facility] Medication [facility] [address] [place] [place], [place] [address] [phone] --Must be a resident of [facility] -- Must have NO insurance coverage whatsoever (no Medicaid/ Medicare, etc.) -- The pt. MUST have a primary care doctor that directs their care regularly and follows them in the community   [facility]ist  [phone]   [employer]  [phone]    Agencies that provide inexpensive medical care:  Organization         Address  Phone   Notes  [facility]  [phone]   [facility]    [phone]   [facility] [address] [place], [place] [address] [phone]   [facility] [address] [place], [place] [phone]   Planned Parenthood    [phone]   [facility]    [phone]   [facility]  [address], [place] Phone:  [phone], Fax:  [phone] Hours of Operation:  9 am - 6  pm, M-F.  Also accepts Medicaid/Medicare and self-pay.  [facility] for Children  [address], [place] Phone: [phone], Fax: [phone]. Hours of Operation:  8:30 am - 5:30 pm, M-F.  Also accepts Medicaid and self-pay.  [facility] [place] [address], [place] Phone: [phone]   [facility] [address] Natasha Bence [place], [place] [phone], Ext. [phone] Mondays & Thursdays: 7-9 AM.  First 15 patients are seen on a first come, first serve basis.    Medicaid-accepting [facility] Providers:  Organization         Address  Phone   Notes  [facility] [address], [place] [phone] Also accepts self-pay patients.  [facility] [address] Laurell Josephs [place], [place]  [phone]   [facility] [address], [place] [phone]   [facility] [address] [place] [phone]   Renaye Rakers [address], [place]   [phone] Only accepts [place] Access [place] patients after they have their name applied to their card.   Self-Pay (no insurance) in [facility]:  Organization         Address  Phone   Notes  Sickle Cell Patients, [facility] Internal Medicine [address] [place], [place] [phone]   [facility] [address] [place], [place] [phone]   [facility] [place]  [address], [place] [phone]   [facility]/Dr. Osei-Bonsu  [address], [place] or [address], [place] [phone] Phone number for both [place] and [place] locations is the same.  [facility] [address], [place] [phone]   [facility] [address] [place] or [address] [phone]   [facility] [address], [place] [phone], phone; [phone], fax Sees patients 1st and 3rd Saturday of every month.  Must not qualify for public or private insurance (i.e. Medicaid, Medicare, [place] Health Choice, Veterans' Benefits)  Household income should be no more than 200% of the poverty level The clinic cannot treat you if you are pregnant or think you are pregnant  Sexually transmitted diseases are not treated at the clinic.    Dental Care: Organization         Address  Phone  Notes  [facility] Department of [facility] [address] [place], [place] [phone] Accepts children up to age 90 who are enrolled in [place] or [place] Health Choice; pregnant women with a Medicaid card; and children who have applied for Medicaid or [place] Health Choice, but were declined,  whose parents can pay a reduced fee at time of service.  [facility] Department of [facility]  [address], [place] [phone] Accepts children up to age 57 who are enrolled in [place] or [place] Health Choice; pregnant women with a Medicaid card; and children who have applied for Medicaid or  Health Choice, but were declined, whose parents can pay a reduced fee at time of service.  [facility]  [address] [place], [place] [phone] Patients are seen by appointment only. Walk-ins are not accepted. [facility] will see patients 53 years of age and older. Monday - Tuesday (8am-5pm) Most Wednesdays (8:30-5pm) $30 per visit, cash only  [facility]  [address], [facility] [phone] Patients are seen by appointment only. Walk-ins are not accepted. [facility] will see patients 56 years of age and older. One Wednesday Evening (Monthly: Volunteer Based).  $30 per visit, cash only  [employer]  [phone] for adults; Children under age 66, call Graduate Pediatric Dentistry at [phone]. Children aged 66-14, please call [phone] to request a pediatric application.  Dental services are provided in all areas of dental care including fillings, crowns and bridges, complete and partial dentures, implants, gum treatment, root canals, and extractions. Preventive care is also provided. Treatment is provided to both adults and children. Patients are selected via a lottery and there is often a waiting list.   [facility] [address] [place]  [phone] [email or web address]   [facility] [address] [place], [place] [phone], Ext. [phone] Second and Fourth Thursday of each month, opens at 6:30 AM; Clinic ends at 9 AM.  Patients are seen on a first-come first-served basis, and a limited number are seen during each clinic.   [facility]  [address] Ether [place], [place] [phone]   Eligibility Requirements You must have lived in [place], [place], or [place] counties for at least the last three months.   You cannot be eligible for state or federal sponsored [place], including [employer], [place], or [employer].   You generally cannot be eligible for healthcare insurance through your employer.    How to apply: Eligibility screenings are held every Tuesday and Wednesday afternoon from 1:00 pm until 4:00 pm. You do not need an appointment for the interview!  [facility] [address] [place] [address], [place], [place] [phone]   [facility] Health Department  [phone]   [facility] Health Department  [phone]   [facility] Health Department  [phone]    Behavioral Health Resources in the Community: Intensive Outpatient Programs Organization         Address  Phone  Notes  [facility] [address] [place], [place] [phone]   [facility] Outpatient [address] [place], [place] [phone]   ADS: Alcohol & Drug Svcs  [address] [place], [place]  [phone]   [facility] Mental Health [address],  [place], [place] [phone] or [phone]   Substance Abuse Resources Organization         Address  Phone  Notes  Al[facility] Drug Services  [phone]   [facility]  [phone]   [facility]  [phone]   Floydene Flock  [phone]   Residential & Outpatient Substance Abuse Program  [phone]   Psychological Services Organization  Address  Phone  Notes  [facility]  [phone]   [facility] Services  [phone]   [facility] Mental Health [phone], [place] [phone] or [phone]    Mobile Crisis Teams Organization         Address  Phone  Notes  [facility], Mobile Crisis Care Unit  [phone]   [facility]  [address] [place], [place] [phone]   Doristine Locks [address] [place] [phone]    Self-Help/Support Groups Organization         Address  Phone             Notes  [facility] [place] - variety of support groups  336- I7437963 Call for more information  Narcotics Anonymous (NA), Caring Services [address] [place] [address], [employer] [place]  2 meetings at this location   Statistician         Address  Phone  Notes  [facility] [address],    [place]  [phone]   [facility]  [address], [place] [address], [place], [place] [phone]   [facility] [address] [place], [place] [phone] Admissions: 8am-3pm M-F  [facility] [address],    [place], [place] [phone]   [facility] [address] [place], [place], [place] [phone]   [facility] [address],  [place], [place] [phone]   [facility] - Intensive Outpatient [address] Laurell Josephs [address], [place], [place]  [phone]   [facility] (Addiction Recovery Care Assoc.) [address] [place]  [place], [place] [phone] or [phone]   [facility] (RTS) [address] [place], [place] [phone] Accepts Medicaid  [facility] [place] [address],  [place] [phone] Substance Abuse/Addiction Treatment   [facility] Organization         Address  Phone  Notes  [facility]  [phone]   Angie Fava, PhD [address] Ervin Knack [place], [place]   [phone] or [phone]   [facility] Behavioral   [address] [place], [place] [phone]   [facility] [address] [place], [place] [phone] Insurance/Medicaid/sponsorship through [facility] and Families [address]                                    [place], [place] [phone] Therapy/tele-psych/case  [facility] [address], [place] [phone]    Dr. Lolly Mustache  [phone]   [facility] [place]  [employer] [facility] Dept. 1) [address] [place] 2) [address], [place] 3)  [address], [place] [phone]  [phone]   [facility] Child Abuse Hotline 934-488-6615 or [phone] (After Hours)        Behavioral Health Resources in the [facility]  Intensive Outpatient Programs: [facility]      [address] [place], [place] [phone] Both a day and evening program       [facility] Outpatient     [address]        [place], [place] [address] [phone]         ADS: Alcohol & Drug  Svcs [address] [place] [phone]  [facility] Mental Health ACCESS LINE: [phone] or [phone] [address] [place], [place] [address] [email or web address]  Mobile Crisis Teams:                                        Therapeutic  Alternatives         Mobile Crisis Care Unit [phone]             [facility] [address] Ginette Otto [phone]                                         Interventionist [address] De[address] [place] [phone]  Self-Help/Support Groups: Mental Health Assoc. of [employer] of support groups 7016424570 (call for more info)  Narcotics Anonymous (NA) [facility] [address] [place] - 2 meetings at this location  Residential Treatment Programs:  [facility]      [address]        [place]       [phone]         [facility] [address], [place] [address] [place], [place]  [address] [phone]  [facility]  [address] [place], [place] [address] [phone] Admissions: 8am-3pm M-F  [facility]     [address]        [place], [place] [address]       [phone]         [facility] [address] Starling Manns [place], [place] [phone]  [facility] [address] [place], [place] [phone]  [facility] - Intensive Outpatient      [address]     [place], [place]       [phone]         [facility] (Addiction Recovery Care Assoc.)     [address] [place], [place] [phone] or [phone]  [facility] Services (RTS)  [address] [place], [place] [phone]  [facility] [address]                                               [address] [place] [phone]  [facility] Resources: [phone]               General Therapy                                                Angie Fava, PhD        [address] [place], [place] [address]         [phone]   Insurance  [address] Behavioral   [address] [place], [place]  [address] [phone]  Floydene Flock  Recovery [address] [place], [place] [address] [phone] Insurance/Medicaid/sponsorship through [facility] and Families                                              [address]                                        [place], [place] [address]    Therapy/tele-psych/case         [phone]          [facility] [address], [place]  [address]  Adolescent/group home/case management [phone]                                           Creola Corn PhD       General therapy       Insurance   [id number]         Dr. Lolly Mustache Insurance [phone] M-F  [facility]/Residential Medicaid, sponsorship [phone]

## 2014-12-10 NOTE — ED Provider Notes (Signed)
CSN: [id number]     Arrival date & time 12/10/14  0707 History   First MD Initiated Contact with Patient 12/10/14 [phone]     Chief Complaint  Patient presents with  . Depression     (Consider location/radiation/quality/duration/timing/severity/associated sxs/prior Treatment) The history is provided by the patient.     Pt presents with depression.  States she has felt depressed for the past  5 weeks, has had trouble sleeping, decreased appetite, anhedonia, social isolation.  She has quit her job and lost many friends.  Has lost 15 pounds from not eating.  Is taking online classes, not keeping up.  She was seen on 11/09/14 for same and was not able to get a follow up appointment with a counselor and has not been started on any medications.  Has occasional anxiety.  Denies SI, HI, visual or auditory hallucinations.   History reviewed. No pertinent past medical history. History reviewed. No pertinent past surgical history. History reviewed. No pertinent family history. Social History  Substance Use Topics  . Smoking status: Current Some Day Smoker  . Smokeless tobacco: None  . Alcohol Use: 1.2 oz/week    2 Shots of liquor per week   OB History    No data available     Review of Systems  All other systems reviewed and are negative.     Allergies  Review of patient's allergies indicates no known allergies.  Home Medications   Prior to Admission medications   Medication Sig Start Date End Date Taking? Authorizing Provider  diphenhydrAMINE (BENADRYL) 25 MG tablet Take 1 tablet (25 mg total) by mouth every 6 (six) hours. Patient not taking: Reported on 11/09/2014 08/24/14   Marisa Severin, MD  EPINEPHrine 0.3 mg/0.3 mL IJ SOAJ injection Inject 0.3 mLs (0.3 mg total) into the muscle once. 08/24/14   Marisa Severin, MD  famotidine (PEPCID) 20 MG tablet Take 1 tablet (20 mg total) by mouth 2 (two) times daily. Patient not taking: Reported on 11/09/2014 08/24/14   Marisa Severin, MD  predniSONE  (DELTASONE) 20 MG tablet Take 3 tablets (60 mg total) by mouth daily. Patient not taking: Reported on 11/09/2014 08/24/14   Marisa Severin, MD   BP 113/52 mmHg  Pulse 84  Temp(Src) 99.5 F (37.5 C) (Oral)  Resp 16  SpO2 100%  LMP 11/24/2014 (Exact Date) Physical Exam  Constitutional: She appears well-developed and well-nourished. No distress.  HENT:  Head: Normocephalic and atraumatic.  Neck: Neck supple.  Cardiovascular: Normal rate, regular rhythm and intact distal pulses.   Pulmonary/Chest: Effort normal and breath sounds normal. No respiratory distress. She has no wheezes. She has no rales.  Abdominal: Soft. She exhibits no distension. There is no tenderness. There is no rebound and no guarding.  Musculoskeletal: She exhibits no edema.  Neurological: She is alert.  Skin: She is not diaphoretic.  Psychiatric: Her speech is normal. She is slowed. She exhibits a depressed mood. She expresses no homicidal and no suicidal ideation.  Nursing note and vitals reviewed.   ED Course  Procedures (including critical care time) Labs Review Labs Reviewed - No data to display  Imaging Review No results found. I have personally reviewed and evaluated these images and lab results as part of my medical decision-making.   EKG Interpretation None       9:01 AM I spoke with Weber Cooks, social worker, who has seen and assessed the patient.  I do feel pt is appropriate for outpatient follow up but would like recommendations  from psychiatry for possible medications to start and want to ensure good resources for outpatient follow up, which Tammy Sours has recommended to the patient.   11:39 AM Psychiatry does not recommend starting medications in ED.  Resources provided for patient for outpatient treatment.    MDM   Final diagnoses:  Depression    Otherwise healthy 19 years old female with depression x 5 weeks.  Not yet able to find counseling, not on medications.  Pt provided multiple resources.  No  SI, HI.  Not psychotic.  D/C home. Discussed treatment, and follow up  with patient.  Pt given return precautions.  Pt verbalizes understanding and agrees with plan.        Trixie Dredge, PA-C 12/10/14 1538  Melene Plan, DO 12/10/14 1538

## 2014-12-10 NOTE — BH Assessment (Signed)
Tele Assessment Note   Ashlynne Shetterly is an 19 y.o. female who presents to [facility] emergency department with the chief complaint of depression. She reports that she has felt depressed for 1 month and that she has also googled symptoms of depression, feeling as if she endorses most of them. Patient states that she feels helpless, hopeless, experiences insomnia, and has increased social isolation from others. Patient reports that she is a Holiday representative at [employer] and that she quit dancing which took away from her opportunity to be around others and be social. Patient states that she has a support system that include her parents, brother, grandmother, and ex-boyfriend. Patient reports additional stressors that include moving into a new apartment and currently being unemployed at the moment. She reports increased anxiety but denies any occurences of panic attacks. Patient denies using any substances and also denies SI/HI/AVH at this time. Patient reports that she is agreeable with outpatient therapy but states she tried talking to a counselor at [facility] [employer] but "they did not give me the help I thought I needed".   Diagnosis: Adjustment Disorder with Depressed Mood   Past Medical History: History reviewed. No pertinent past medical history.  History reviewed. No pertinent past surgical history.  Family History: History reviewed. No pertinent family history.  Social History:  reports that she has been smoking.  She does not have any smokeless tobacco history on file. She reports that she drinks about 1.2 oz of alcohol per week. She reports that she does not use illicit drugs.  Additional Social History:  Alcohol / Drug Use History of alcohol / drug use?: No history of alcohol / drug abuse  CIWA: CIWA-Ar BP: (!) 113/52 mmHg Pulse Rate: 84 COWS:    PATIENT STRENGTHS: (choose at least two) Ability for insight Supportive family/friends  Allergies: No Known Allergies  Home Medications:  (Not in a  hospital admission)  OB/GYN Status:  Patient's last menstrual period was 11/24/2014 (exact date).  General Assessment Data Location of Assessment: [facility] TTS Assessment: In system Is this a Tele or Face-to-Face Assessment?: Tele Assessment Is this an Initial Assessment or a Re-assessment for this encounter?: Initial Assessment Marital status: Single Is patient pregnant?: No Pregnancy Status: No Living Arrangements: Alone Can pt return to current living arrangement?: Yes Admission Status: Voluntary Is patient capable of signing voluntary admission?: Yes Referral Source: Self/Family/Friend Insurance type: [employer]     Crisis Care Plan Living Arrangements: Alone Name of Psychiatrist: None Name of Therapist: None  Education Status Is patient currently in school?: Yes Current Grade: Junior Highest grade of school patient has completed: Sophomore Name of school: [employer] Contact person: Patient  Risk to self with the past 6 months Suicidal Ideation: No Has patient been a risk to self within the past 6 months prior to admission? : No Suicidal Intent: No Has patient had any suicidal intent within the past 6 months prior to admission? : No Is patient at risk for suicide?: No Suicidal Plan?: No Has patient had any suicidal plan within the past 6 months prior to admission? : No Access to Means: No What has been your use of drugs/alcohol within the last 12 months?: Patient denies Previous Attempts/Gestures: No How many times?: 0 Other Self Harm Risks: None Triggers for Past Attempts: None known Intentional Self Injurious Behavior: None Family Suicide History: No Persecutory voices/beliefs?: No Depression: Yes Depression Symptoms: Insomnia, Isolating, Fatigue, Loss of interest in usual pleasures, Feeling worthless/self  pity, Guilt Substance abuse history and/or treatment for substance abuse?: No Suicide prevention information given to non-admitted patients: Not  applicable  Risk to Others within the past 6 months Homicidal Ideation: No Does patient have any lifetime risk of violence toward others beyond the six months prior to admission? : No Thoughts of Harm to Others: No Current Homicidal Intent: No Current Homicidal Plan: No Access to Homicidal Means: No Identified Victim: None History of harm to others?: No Assessment of Violence: None Noted Violent Behavior Description: Patient is calm and cooperative Does patient have access to weapons?: No Criminal Charges Pending?: No Does patient have a court date: No Is patient on probation?: No  Psychosis Hallucinations: None noted Delusions: None noted  Mental Status Report Appearance/Hygiene: Unremarkable Eye Contact: Good Motor Activity: Freedom of movement Speech: Logical/coherent Level of Consciousness: Alert Mood: Sad Affect: Sad Anxiety Level: Minimal Thought Processes: Coherent, Relevant Judgement: Impaired Orientation: Person, Place, Time, Situation Obsessive Compulsive Thoughts/Behaviors: None  Cognitive Functioning Concentration: Decreased Memory: Recent Intact, Remote Intact IQ: Average Insight: Fair Impulse Control: Fair Appetite: Poor Weight Loss: 15 Weight Gain: 0 Sleep: Decreased Total Hours of Sleep: 5 Vegetative Symptoms: None  ADLScreening [facility]) Patient's cognitive ability adequate to safely complete daily activities?: Yes Patient able to express need for assistance with ADLs?: Yes Independently performs ADLs?: Yes (appropriate for developmental age)  Prior Inpatient Therapy Prior Inpatient Therapy: No  Prior Outpatient Therapy Prior Outpatient Therapy: No  ADL Screening (condition at time of admission) Patient's cognitive ability adequate to safely complete daily activities?: Yes Is the patient deaf or have difficulty hearing?: No Does the patient have difficulty seeing, even when wearing glasses/contacts?: No Does the patient  have difficulty concentrating, remembering, or making decisions?: No Patient able to express need for assistance with ADLs?: Yes Does the patient have difficulty dressing or bathing?: No Independently performs ADLs?: Yes (appropriate for developmental age) Does the patient have difficulty walking or climbing stairs?: No Weakness of Legs: None Weakness of Arms/Hands: None  Home Assistive Devices/Equipment Home Assistive Devices/Equipment: None  Therapy Consults (therapy consults require a physician order) PT Evaluation Needed: No OT Evalulation Needed: No SLP Evaluation Needed: No Abuse/Neglect Assessment (Assessment to be complete while patient is alone) Physical Abuse: Denies Verbal Abuse: Denies Sexual Abuse: Denies Exploitation of patient/patient's resources: Denies Self-Neglect: Denies Values / Beliefs Cultural Requests During Hospitalization: None Spiritual Requests During Hospitalization: None Consults Spiritual Care Consult Needed: No Social Work Consult Needed: No Merchant navy officer (For Healthcare) Does patient have an advance directive?: No Would patient like information on creating an advanced directive?: No - patient declined information    Additional Information 1:1 In Past 12 Months?: No CIRT Risk: No Elopement Risk: No Does patient have medical clearance?: Yes     Disposition:  Disposition Initial Assessment Completed for this Encounter: Yes Disposition of Patient: Outpatient treatment  Haskel Khan 12/10/2014 8:39 AM

## 2014-12-10 NOTE — [facility] Counselor (Addendum)
Spoke with patient regarding outpatient resources. Patient states that she would prefer not to bill insurance and would prefer places that would not bill insurance. Patient provided with information for [facility] and [facility] along with psychiatry and therapy. Patient provided with a pamphlet to Partial Hospitalization Program at [facility] and counselor explained information. Patient denies questions or concerns at this time. Patient provided information for mobile crisis and suicide prevention pamphlet provided.   Patient requested medications for sleep. Counselor notified patients EDP of patients request.   Davina Poke, LCSW Therapeutic Triage Specialist [facility] 12/10/2014 11:42 AM

## 2014-12-10 NOTE — Progress Notes (Signed)
Clinician spoke with Trixie Dredge, PA-C in regards to evaluation. PA-C would like for psychiatry to assess patient for medication recommendations at this time.   Clinician to notify SAPPU extender and MD of request.    Janann Colonel. MSW, LCSW Therapeutic Triage Services-Triage Specialist   Phone: [phone]

## 2015-05-24 ENCOUNTER — Other Ambulatory Visit: Payer: Self-pay | Admitting: Obstetrics & Gynecology

## 2015-05-24 DIAGNOSIS — E049 Nontoxic goiter, unspecified: Secondary | ICD-10-CM

## 2015-05-30 ENCOUNTER — Other Ambulatory Visit: Payer: Self-pay

## 2015-06-08 ENCOUNTER — Ambulatory Visit
Admission: RE | Admit: 2015-06-08 | Discharge: 2015-06-08 | Disposition: A | Discharge status: Home / Self Care | Payer: BLUE CROSS/BLUE SHIELD | Source: Ambulatory Visit | Attending: Obstetrics & Gynecology | Admitting: Obstetrics & Gynecology

## 2015-06-08 DIAGNOSIS — E049 Nontoxic goiter, unspecified: Secondary | ICD-10-CM

## 2016-10-16 IMAGING — US US THYROID
1 series · 14 of 25 positions shown · non-contrast
Comparison: None.

CLINICAL DATA: Enlarged thyroid by exam

EXAM:
THYROID ULTRASOUND
TECHNIQUE: Ultrasound examination of the thyroid gland and adjacent soft
tissues was performed.

[Series 1: us thyroid · 0.07mm/px · 14 of 35 slices shown]
[im 1/35]
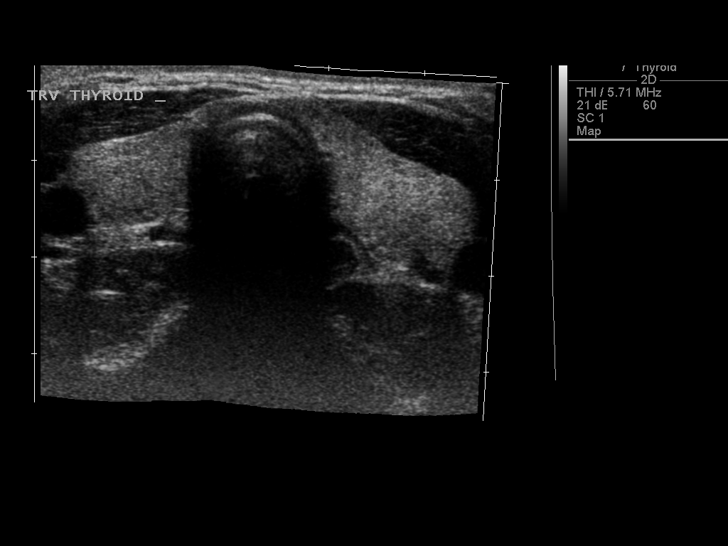
[im 3/35]
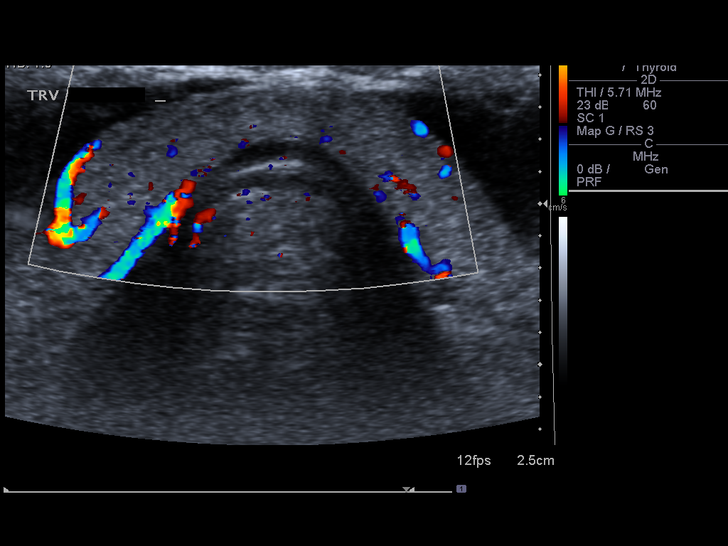
[im 6/35]
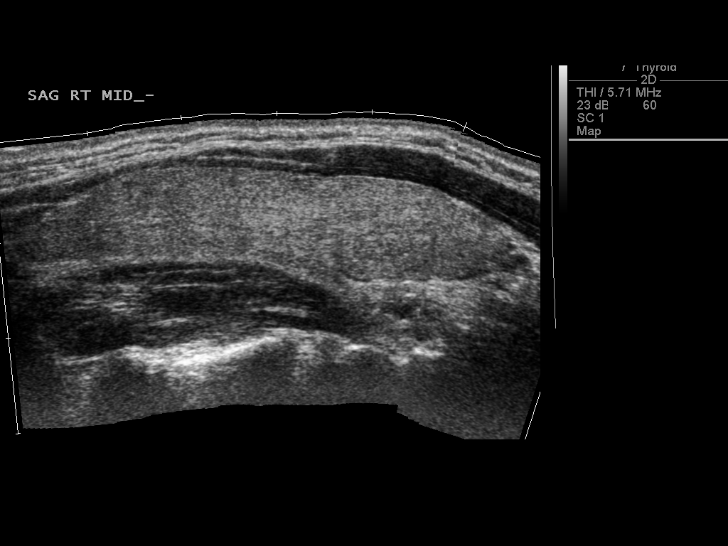
[im 9/35]
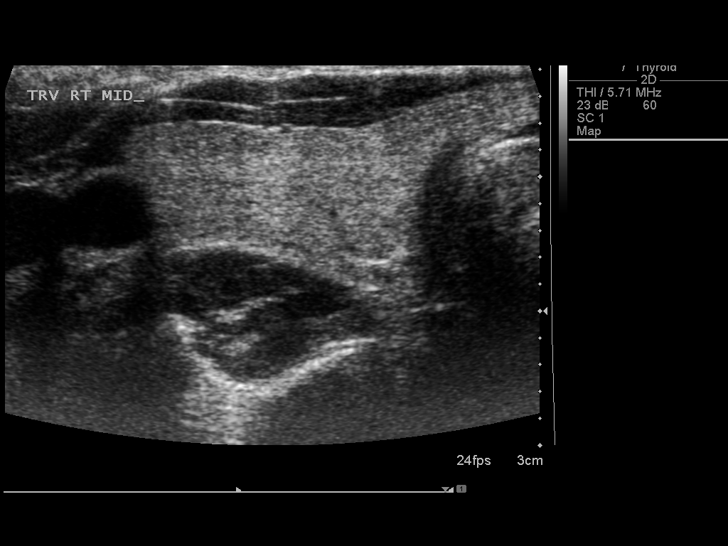
[im 12/35]
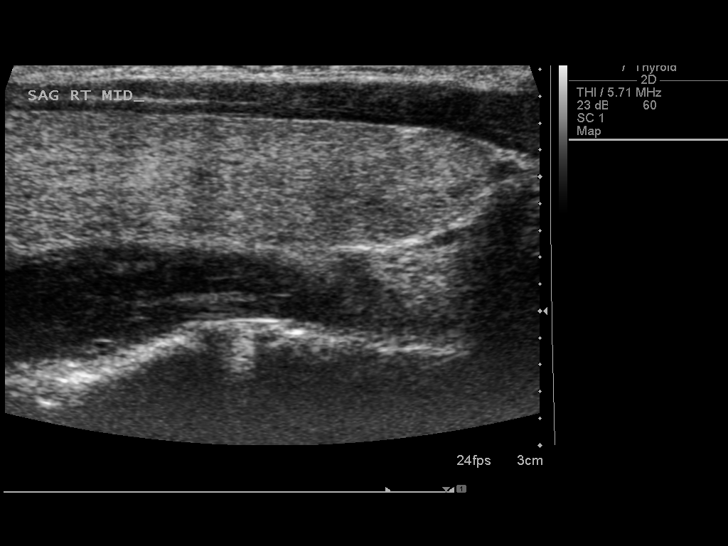
[im 13/35]
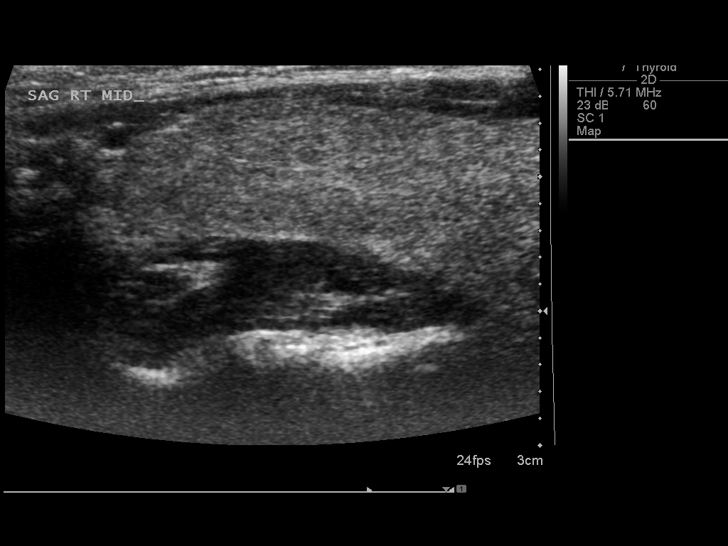
[im 16/35]
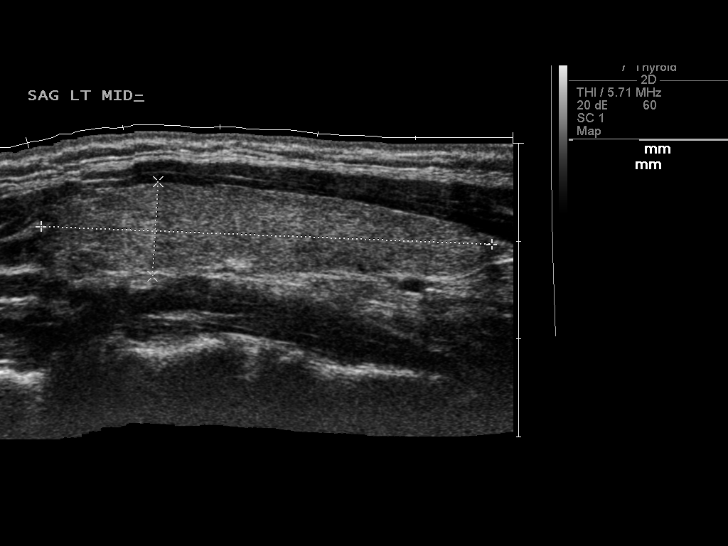
[im 19/35]
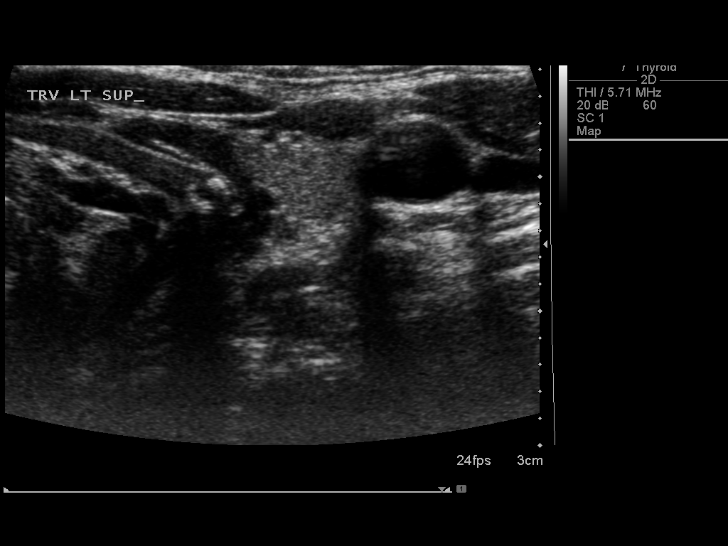
[im 22/35]
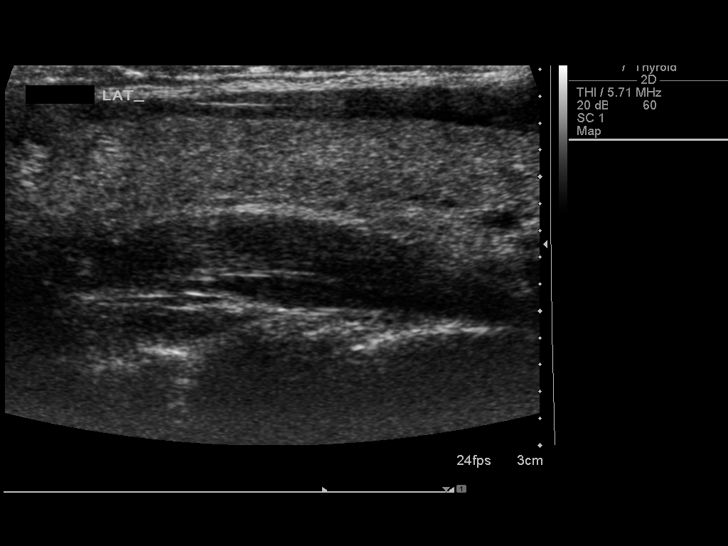
[im 23/35]
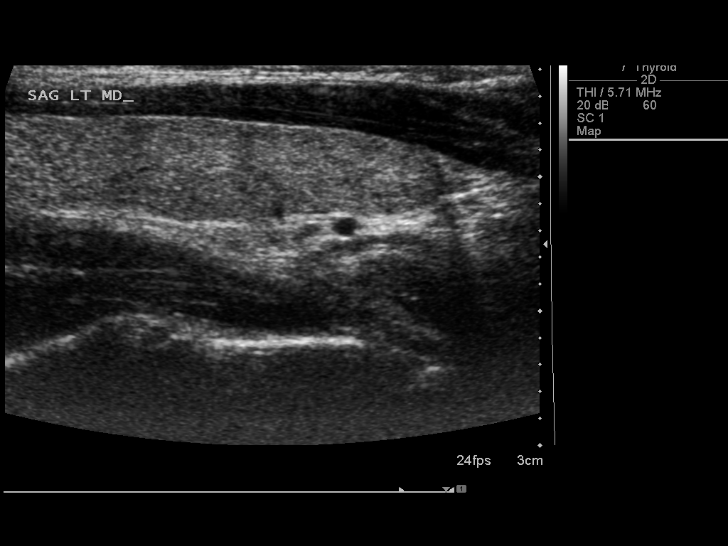
[im 26/35]
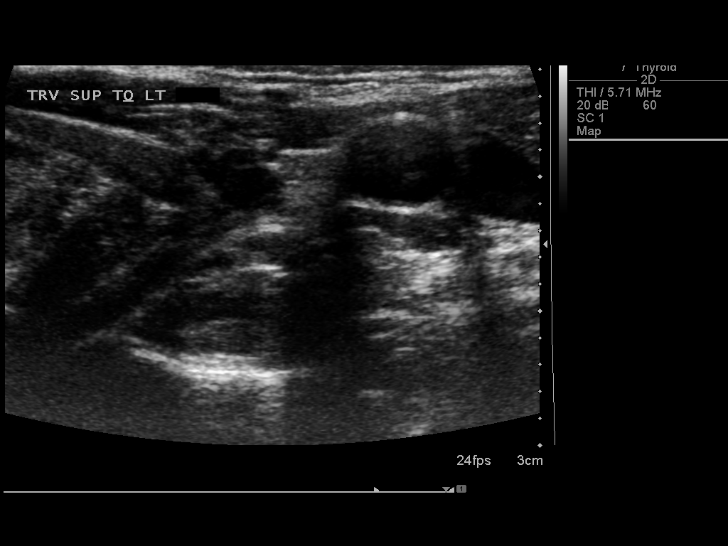
[im 29/35]
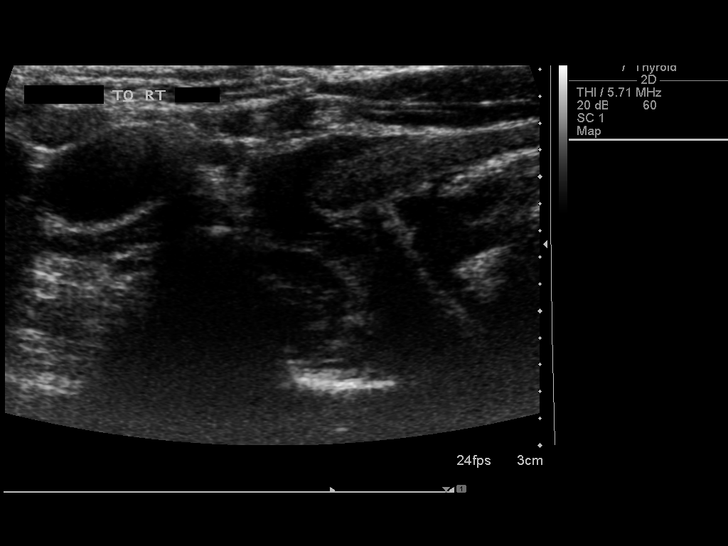
[im 32/35]
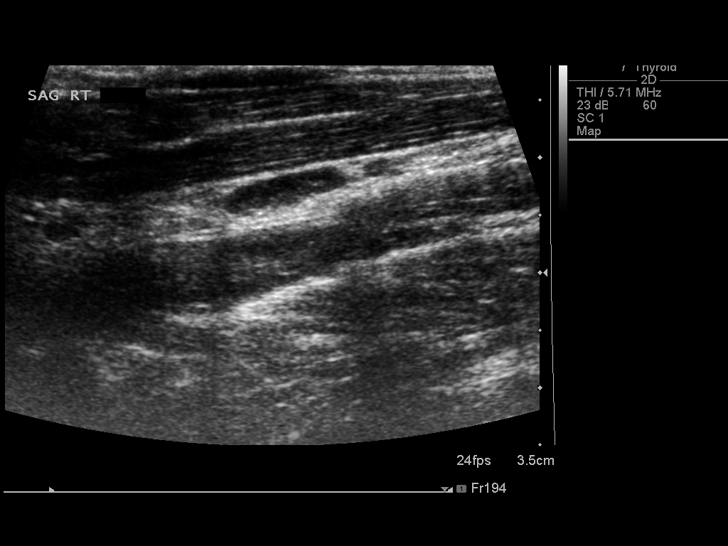
[im 35/35]
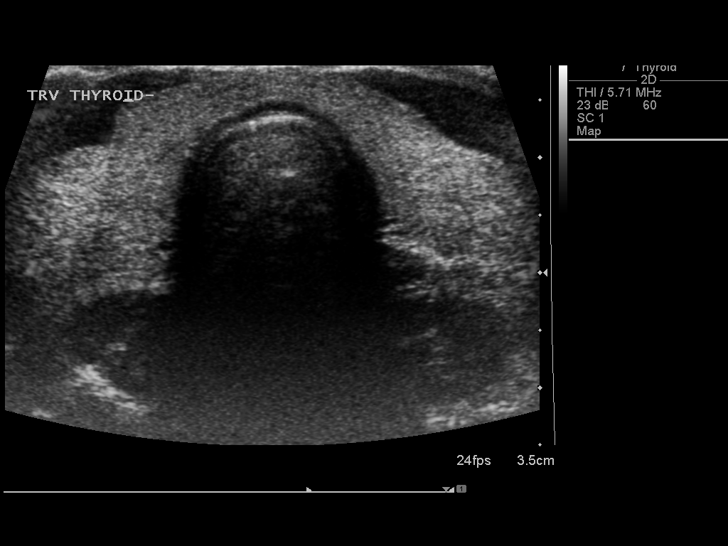

[14 of 25 positions shown; findings below may reference images not displayed]

FINDINGS: Right thyroid lobe

Measurements: 5.2 x 1.1 x 1.9 cm.  No nodules visualized.

Left thyroid lobe

Measurements: 4.6 x 1.0 x 1.9 cm.  No nodules visualized.

Isthmus

Thickness: 4 mm.  No nodules visualized.

Lymphadenopathy

None visualized.
IMPRESSION: Normal thyroid ultrasound
